# Patient Record
Sex: Male | Born: 1970 | Race: White | Hispanic: No | Marital: Married | Smoking: Former smoker
Health system: Southern US, Community
[De-identification: ages and names within clinical notes are randomized; demographics above are authoritative.]

## PROBLEM LIST (undated history)

## (undated) DIAGNOSIS — R42 Dizziness and giddiness: Secondary | ICD-10-CM

## (undated) DIAGNOSIS — G4730 Sleep apnea, unspecified: Secondary | ICD-10-CM

## (undated) DIAGNOSIS — Z87898 Personal history of other specified conditions: Secondary | ICD-10-CM

## (undated) DIAGNOSIS — K5792 Diverticulitis of intestine, part unspecified, without perforation or abscess without bleeding: Secondary | ICD-10-CM

## (undated) HISTORY — PX: LEG SURGERY: SHX1003

## (undated) HISTORY — PX: TONSILLECTOMY: SUR1361

## (undated) HISTORY — PX: UVULOPALATOPHARYNGOPLASTY: SHX827

---

## 2019-04-29 ENCOUNTER — Emergency Department (INDEPENDENT_AMBULATORY_CARE_PROVIDER_SITE_OTHER)
Admission: EM | Admit: 2019-04-29 | Discharge: 2019-04-29 | Disposition: A | Discharge status: Home / Self Care | Payer: Managed Care, Other (non HMO) | Source: Home / Self Care | Attending: Family Medicine | Admitting: Family Medicine

## 2019-04-29 ENCOUNTER — Other Ambulatory Visit: Payer: Self-pay

## 2019-04-29 ENCOUNTER — Encounter: Payer: Self-pay | Admitting: *Deleted

## 2019-04-29 DIAGNOSIS — H8111 Benign paroxysmal vertigo, right ear: Secondary | ICD-10-CM | POA: Diagnosis not present

## 2019-04-29 DIAGNOSIS — M542 Cervicalgia: Secondary | ICD-10-CM

## 2019-04-29 HISTORY — DX: Personal history of other specified conditions: Z87.898

## 2019-04-29 HISTORY — DX: Sleep apnea, unspecified: G47.30

## 2019-04-29 MED ORDER — MECLIZINE HCL 25 MG PO TABS: 25.0000 mg | ORAL_TABLET | Freq: Three times a day (TID) | ORAL | 0 refills | Status: DC | PRN

## 2019-04-29 MED ORDER — PREDNISONE 20 MG PO TABS: ORAL_TABLET | ORAL | 0 refills | Status: DC

## 2019-04-29 NOTE — Discharge Instructions (Addendum)
Increase fluid intake.  Rest. ° °If symptoms become significantly worse during the night or over the weekend, proceed to the local emergency room.  °

## 2019-04-29 NOTE — ED Triage Notes (Signed)
Pt reports that he awoke with dizziness at 0400 today. He reports some nausea with vomit x 1; diarrhea intermittently from 0400-0530. He also notes neck pain intermittently x 2 days with certain movements. Denies injury.
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
## 2020-02-21 ENCOUNTER — Emergency Department (INDEPENDENT_AMBULATORY_CARE_PROVIDER_SITE_OTHER)
Admission: EM | Admit: 2020-02-21 | Discharge: 2020-02-21 | Disposition: A | Discharge status: Home / Self Care | Payer: Managed Care, Other (non HMO) | Source: Home / Self Care

## 2020-02-21 ENCOUNTER — Other Ambulatory Visit: Payer: Self-pay

## 2020-02-21 ENCOUNTER — Emergency Department (INDEPENDENT_AMBULATORY_CARE_PROVIDER_SITE_OTHER): Payer: Managed Care, Other (non HMO)

## 2020-02-21 DIAGNOSIS — K5732 Diverticulitis of large intestine without perforation or abscess without bleeding: Secondary | ICD-10-CM | POA: Diagnosis not present

## 2020-02-21 DIAGNOSIS — R1033 Periumbilical pain: Secondary | ICD-10-CM

## 2020-02-21 DIAGNOSIS — R1030 Lower abdominal pain, unspecified: Secondary | ICD-10-CM

## 2020-02-21 LAB — POCT URINALYSIS DIP (MANUAL ENTRY)
Bilirubin, UA: NEGATIVE
Blood, UA: NEGATIVE
Glucose, UA: NEGATIVE mg/dL
Ketones, POC UA: NEGATIVE mg/dL
Leukocytes, UA: NEGATIVE
Nitrite, UA: NEGATIVE
Spec Grav, UA: >=1.03 — AB (ref 1.010–1.025)
Urobilinogen, UA: 0.2 E.U./dL
pH, UA: 5.5 (ref 5.0–8.0)

## 2020-02-21 LAB — POCT CBC W AUTO DIFF (K'VILLE URGENT CARE)

## 2020-02-21 MED ORDER — AMOXICILLIN-POT CLAVULANATE 875-125 MG PO TABS: 1.0000 | ORAL_TABLET | Freq: Two times a day (BID) | ORAL | 0 refills | Status: DC

## 2020-02-21 MED ORDER — IOHEXOL 300 MG/ML  SOLN
100.0000 mL | Freq: Once | INTRAMUSCULAR | Status: AC | PRN
  Administered 2020-02-21: 100 mL via INTRAVENOUS

## 2020-02-21 MED ORDER — ONDANSETRON 4 MG PO TBDP: 4.0000 mg | ORAL_TABLET | Freq: Four times a day (QID) | ORAL | 0 refills | Status: DC | PRN

## 2020-02-21 MED ORDER — TRAMADOL HCL 50 MG PO TABS: 50.0000 mg | ORAL_TABLET | Freq: Four times a day (QID) | ORAL | 0 refills | Status: DC | PRN

## 2020-02-21 NOTE — Discharge Instructions (Signed)
  Please take antibiotics as prescribed and be sure to complete entire course even if you start to feel better to ensure infection does not come back.  Tramadol is strong pain medication. While taking, do not drink alcohol, drive, or perform any other activities that requires focus while taking these medications.   It is recommended you stick with a clear liquid diet today and slowly transition to a soft diet, then regular diet as pain and nausea start to resolve.  Please schedule a follow up appointment with a GI (stomach) specialist and a primary care provider as it is recommended you receive a colonoscopy after your first episode of diverticulitis and to make sure your symptoms are improving.  Call 911 or go to the hospital if symptoms worsening- worsening pain, unable to keep down fluids, dizziness, or other new concerning symptoms develop.
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
## 2020-02-21 NOTE — ED Triage Notes (Signed)
Patient presents to Urgent Care with complaints of abdominal pain since yesterday morning, progressively getting worse. Patient reports it got so bad it made him nauseous by the end of yesterday. Pt endorses severe bladder burning, worse when he urinates. Pt states he has also felt achy in his shoulders and neck. Pt denies vomiting or diarrhea.

## 2020-02-22 LAB — COMPLETE METABOLIC PANEL WITH GFR
AG Ratio: 1.1 (calc) (ref 1.0–2.5)
ALT: 16 U/L (ref 9–46)
AST: 23 U/L (ref 10–40)
Albumin: 4.2 g/dL (ref 3.6–5.1)
Alkaline phosphatase (APISO): 65 U/L (ref 36–130)
BUN: 13 mg/dL (ref 7–25)
CO2: 21 mmol/L (ref 20–32)
Calcium: 9.2 mg/dL (ref 8.6–10.3)
Chloride: 104 mmol/L (ref 98–110)
Creat: 0.94 mg/dL (ref 0.60–1.35)
GFR, Est African American: 111 mL/min/{1.73_m2} (ref >=60)
GFR, Est Non African American: 95 mL/min/{1.73_m2} (ref >=60)
Globulin: 3.7 g/dL (calc) (ref 1.9–3.7)
Glucose, Bld: 100 mg/dL — ABNORMAL HIGH (ref 65–99)
Potassium: 4.9 mmol/L (ref 3.5–5.3)
Sodium: 137 mmol/L (ref 135–146)
Total Bilirubin: 0.9 mg/dL (ref 0.2–1.2)
Total Protein: 7.9 g/dL (ref 6.1–8.1)

## 2020-02-22 LAB — URINE CULTURE
MICRO NUMBER:: 10357201
Result:: NO GROWTH
SPECIMEN QUALITY:: ADEQUATE

## 2020-02-22 LAB — LIPASE: Lipase: 9 U/L (ref 7–60)

## 2020-12-02 ENCOUNTER — Emergency Department (INDEPENDENT_AMBULATORY_CARE_PROVIDER_SITE_OTHER): Payer: Worker's Compensation

## 2020-12-02 ENCOUNTER — Other Ambulatory Visit: Payer: Self-pay

## 2020-12-02 ENCOUNTER — Emergency Department (INDEPENDENT_AMBULATORY_CARE_PROVIDER_SITE_OTHER)
Admission: EM | Admit: 2020-12-02 | Discharge: 2020-12-02 | Disposition: A | Discharge status: Home / Self Care | Payer: Worker's Compensation | Source: Home / Self Care

## 2020-12-02 ENCOUNTER — Encounter: Payer: Self-pay | Admitting: Emergency Medicine

## 2020-12-02 DIAGNOSIS — M25469 Effusion, unspecified knee: Secondary | ICD-10-CM

## 2020-12-02 DIAGNOSIS — M1711 Unilateral primary osteoarthritis, right knee: Secondary | ICD-10-CM

## 2020-12-02 DIAGNOSIS — M179 Osteoarthritis of knee, unspecified: Secondary | ICD-10-CM

## 2020-12-02 DIAGNOSIS — M25461 Effusion, right knee: Secondary | ICD-10-CM

## 2020-12-02 DIAGNOSIS — S8991XA Unspecified injury of right lower leg, initial encounter: Secondary | ICD-10-CM
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
## 2020-12-02 NOTE — ED Triage Notes (Signed)
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

## 2020-12-02 NOTE — Discharge Instructions (Signed)
  You may take 500mg  acetaminophen every 4-6 hours or in combination with ibuprofen 400-600mg  every 6-8 hours as needed for pain and inflammation. You may wear the brace for comfort.  Elevate and apply a cool compress to help with pain and swelling. Call to schedule a follow up appointment with Sports Medicine this week for further evaluation and treatment.

## 2020-12-04 ENCOUNTER — Other Ambulatory Visit: Payer: Self-pay

## 2020-12-04 ENCOUNTER — Ambulatory Visit (INDEPENDENT_AMBULATORY_CARE_PROVIDER_SITE_OTHER): Payer: Managed Care, Other (non HMO) | Admitting: Family Medicine

## 2020-12-04 ENCOUNTER — Ambulatory Visit: Payer: Self-pay

## 2020-12-04 VITALS — BP 132/84 | Ht 70.0 in | Wt 300.0 lb

## 2020-12-04 DIAGNOSIS — M1711 Unilateral primary osteoarthritis, right knee: Secondary | ICD-10-CM | POA: Insufficient documentation

## 2020-12-04 DIAGNOSIS — M25561 Pain in right knee: Secondary | ICD-10-CM

## 2020-12-04 MED ORDER — PREDNISONE 5 MG PO TABS: ORAL_TABLET | ORAL | 0 refills | Status: DC
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
## 2020-12-04 NOTE — Assessment & Plan Note (Signed)
Injury occurred on 1/21.  Injury occurred at work.  Seems to have exacerbated underlying degenerative changes. -Counseled on home exercise therapy and supportive care. -Counseled on knee brace. -Prednisone. -Could consider physical therapy or injection.

## 2020-12-04 NOTE — Patient Instructions (Signed)
Nice to meet you  Please use ice as needed  Please try the range of motion  Please send me a message in MyChart with any questions or updates.  Please see me back in 2 weeks.   --Dr. Jordan Likes

## 2020-12-18 ENCOUNTER — Telehealth: Payer: Self-pay | Admitting: Family Medicine

## 2020-12-18 ENCOUNTER — Ambulatory Visit: Payer: Managed Care, Other (non HMO) | Admitting: Family Medicine

## 2020-12-18 NOTE — Telephone Encounter (Signed)
Letter printed, signed and emailed to pt at email below.

## 2020-12-18 NOTE — Telephone Encounter (Signed)
Provided extension on work note.   Myra Rude, MD [facility] 12/18/2020, 12:02 PM
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
## 2020-12-18 NOTE — Telephone Encounter (Signed)
Amendment to previous note-- Pt states needs Out of Work note written extending his leave until next OV w/Dr. 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

## 2020-12-18 NOTE — Telephone Encounter (Signed)
Called pt to reschedule 2/8 WC follow up appt w/ Dr.Schmitz, per pt RTW note only written till 2/9 & he needs it extended until his ne

## 2020-12-21 ENCOUNTER — Ambulatory Visit: Payer: Managed Care, Other (non HMO) | Admitting: Family Medicine

## 2020-12-24 ENCOUNTER — Other Ambulatory Visit: Payer: Self-pay

## 2020-12-24 ENCOUNTER — Ambulatory Visit (INDEPENDENT_AMBULATORY_CARE_PROVIDER_SITE_OTHER): Payer: Worker's Compensation | Admitting: Family Medicine

## 2020-12-24 VITALS — BP 140/82 | Ht 70.0 in | Wt 300.0 lb

## 2020-12-24 DIAGNOSIS — M1711 Unilateral primary osteoarthritis, right knee: Secondary | ICD-10-CM

## 2020-12-24 NOTE — Assessment & Plan Note (Signed)
Injury occurred on 1/21.  Injury occurred at work.  Pain is ongoing.  Has had improvement with measures made thus far. -Counseled on home exercise therapy and supportive care. -Referral to physical therapy. -Work note provided.

## 2020-12-24 NOTE — Patient Instructions (Signed)
Good to see you Please use ice as needed  Please try physical therapy   Please send me a message in MyChart with any questions or updates.  Please see me back in 4 weeks.   --Dr. Aveena Bari  
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
## 2021-01-22 ENCOUNTER — Ambulatory Visit: Payer: Managed Care, Other (non HMO) | Admitting: Family Medicine

## 2021-06-05 ENCOUNTER — Ambulatory Visit: Payer: Managed Care, Other (non HMO) | Admitting: Family Medicine
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
## 2022-07-05 ENCOUNTER — Ambulatory Visit
Admission: EM | Admit: 2022-07-05 | Discharge: 2022-07-05 | Disposition: A | Discharge status: Home / Self Care | Payer: 59 | Attending: Family Medicine | Admitting: Family Medicine

## 2022-07-05 ENCOUNTER — Other Ambulatory Visit: Payer: Self-pay

## 2022-07-05 DIAGNOSIS — J3489 Other specified disorders of nose and nasal sinuses: Secondary | ICD-10-CM | POA: Diagnosis not present

## 2022-07-05 DIAGNOSIS — J0100 Acute maxillary sinusitis, unspecified: Secondary | ICD-10-CM | POA: Diagnosis not present

## 2022-07-05 MED ORDER — AMOXICILLIN-POT CLAVULANATE 875-125 MG PO TABS: 1.0000 | ORAL_TABLET | Freq: Two times a day (BID) | ORAL | 0 refills | Status: DC

## 2022-07-05 MED ORDER — PREDNISONE 10 MG (21) PO TBPK: ORAL_TABLET | Freq: Every day | ORAL | 0 refills | Status: DC
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
## 2022-07-05 NOTE — ED Triage Notes (Signed)
Pt c/o of right side facial swelling and right eye swelling onset since thursday

## 2022-07-05 NOTE — Discharge Instructions (Addendum)
Advised patient to take medication as directed with food to completion.  Advised patient to take Sterapred Unipak with first dose of Augmentin for the next 10 days.  Encouraged patient increase daily water intake while taking these medications.  Advised patient if symptoms worsen and/or unresolved please follow-up with PCP or here for further evaluation.

## 2022-09-19 ENCOUNTER — Ambulatory Visit
Admission: EM | Admit: 2022-09-19 | Discharge: 2022-09-19 | Disposition: A | Discharge status: Home / Self Care | Payer: 59 | Attending: Family Medicine | Admitting: Family Medicine

## 2022-09-19 ENCOUNTER — Ambulatory Visit (INDEPENDENT_AMBULATORY_CARE_PROVIDER_SITE_OTHER): Payer: 59

## 2022-09-19 DIAGNOSIS — M940 Chondrocostal junction syndrome [Tietze]: Secondary | ICD-10-CM | POA: Diagnosis not present

## 2022-09-19 DIAGNOSIS — J069 Acute upper respiratory infection, unspecified: Secondary | ICD-10-CM

## 2022-09-19 DIAGNOSIS — R059 Cough, unspecified: Secondary | ICD-10-CM | POA: Insufficient documentation

## 2022-09-19 DIAGNOSIS — J029 Acute pharyngitis, unspecified: Secondary | ICD-10-CM | POA: Diagnosis not present

## 2022-09-19 DIAGNOSIS — Z1152 Encounter for screening for COVID-19: Secondary | ICD-10-CM | POA: Diagnosis not present

## 2022-09-19 LAB — RESP PANEL BY RT-PCR (FLU A&B, COVID) ARPGX2
Influenza A by PCR: NEGATIVE
Influenza B by PCR: NEGATIVE
SARS Coronavirus 2 by RT PCR: NEGATIVE

## 2022-09-19 MED ORDER — AZITHROMYCIN 250 MG PO TABS: ORAL_TABLET | ORAL | 0 refills | Status: DC

## 2022-09-19 MED ORDER — PREDNISONE 20 MG PO TABS: ORAL_TABLET | ORAL | 0 refills | Status: DC

## 2022-09-19 NOTE — ED Triage Notes (Signed)
Pt presents to Urgent Care with c/o sore throat, body aches, and cough x 3 days. Has not done COVID test.

## 2022-09-19 NOTE — Discharge Instructions (Signed)
Take plain guaifenesin (1200mg extended release tabs such as Mucinex) twice daily, with plenty of water, for cough and congestion. Get adequate rest.   May take Delsym Cough Suppressant ("12 Hour Cough Relief") at bedtime for nighttime cough.  Try warm salt water gargles for sore throat.  Stop all antihistamines for now, and other non-prescription cough/cold preparations.   If symptoms become significantly worse during the night or over the weekend, proceed to the local emergency room.  
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
## 2023-02-25 ENCOUNTER — Encounter: Payer: Self-pay | Admitting: *Deleted

## 2023-08-27 ENCOUNTER — Other Ambulatory Visit: Payer: Self-pay

## 2023-08-27 ENCOUNTER — Ambulatory Visit: Payer: No Typology Code available for payment source

## 2023-08-27 ENCOUNTER — Ambulatory Visit
Admission: EM | Admit: 2023-08-27 | Discharge: 2023-08-27 | Disposition: A | Discharge status: Home / Self Care | Payer: No Typology Code available for payment source | Attending: Family Medicine | Admitting: Family Medicine

## 2023-08-27 ENCOUNTER — Encounter: Payer: Self-pay | Admitting: Emergency Medicine

## 2023-08-27 DIAGNOSIS — M542 Cervicalgia: Secondary | ICD-10-CM

## 2023-08-27 DIAGNOSIS — R200 Anesthesia of skin: Secondary | ICD-10-CM

## 2023-08-27 DIAGNOSIS — Z87898 Personal history of other specified conditions: Secondary | ICD-10-CM | POA: Diagnosis not present

## 2023-08-27 DIAGNOSIS — R202 Paresthesia of skin: Secondary | ICD-10-CM | POA: Diagnosis not present

## 2023-08-27 MED ORDER — METHYLPREDNISOLONE 4 MG PO TBPK: ORAL_TABLET | ORAL | 0 refills | Status: DC

## 2023-08-27 MED ORDER — OXYCODONE-ACETAMINOPHEN 10-325 MG PO TABS: 1.0000 | ORAL_TABLET | ORAL | 0 refills | Status: DC | PRN

## 2023-08-27 MED ORDER — KETOROLAC TROMETHAMINE 30 MG/ML IJ SOLN
60.0000 mg | Freq: Once | INTRAMUSCULAR | Status: AC
  Administered 2023-08-27: 60 mg via INTRAMUSCULAR

## 2023-08-27 MED ORDER — OXYCODONE-ACETAMINOPHEN 10-325 MG PO TABS: 1.0000 | ORAL_TABLET | Freq: Three times a day (TID) | ORAL | 0 refills | Status: DC | PRN

## 2023-08-27 MED ORDER — TIZANIDINE HCL 4 MG PO TABS: 4.0000 mg | ORAL_TABLET | Freq: Four times a day (QID) | ORAL | 0 refills | Status: DC | PRN

## 2023-08-27 NOTE — Discharge Instructions (Addendum)
Use ice or heat to painful neck muscles Take the Medrol as directed.  Take all of day 1 today.  This is a steroid anti-inflammatory Take tizanidine as a muscle relaxer.  May take up to 3 times a day.  It is important to take a dose at bedtime Take oxycodone in addition as needed for pain.  Do not drive on tramadol See sports medicine if not improving by next week.  I have given you the number for Dr. Benjamin Stain in this building.  He can coordinate physical therapy or additional treatment that may be needed
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
## 2023-08-27 NOTE — ED Triage Notes (Signed)
Neck pain since Tuesday morning .  Woke with pain.  No known injury.  No specific section of neck hurts.  Pain radiating into head and upper back.  Patient is right handed.  Left hand intermittently tingling.

## 2023-08-27 NOTE — ED Triage Notes (Signed)
Patient has a family member with him in lobby.    Patient has been taking goody powders.

## 2024-08-11 ENCOUNTER — Ambulatory Visit
Admission: EM | Admit: 2024-08-11 | Discharge: 2024-08-11 | Disposition: A | Discharge status: Home / Self Care | Attending: Family Medicine | Admitting: Family Medicine

## 2024-08-11 ENCOUNTER — Other Ambulatory Visit: Payer: Self-pay

## 2024-08-11 DIAGNOSIS — H938X2 Other specified disorders of left ear: Secondary | ICD-10-CM

## 2024-08-11 DIAGNOSIS — K5732 Diverticulitis of large intestine without perforation or abscess without bleeding: Secondary | ICD-10-CM

## 2024-08-11 DIAGNOSIS — K5730 Diverticulosis of large intestine without perforation or abscess without bleeding: Secondary | ICD-10-CM

## 2024-08-11 HISTORY — DX: Dizziness and giddiness: R42

## 2024-08-11 HISTORY — DX: Diverticulitis of intestine, part unspecified, without perforation or abscess without bleeding: K57.92

## 2024-08-11 LAB — POCT URINALYSIS DIP (MANUAL ENTRY)
Bilirubin, UA: NEGATIVE
Blood, UA: NEGATIVE
Glucose, UA: NEGATIVE mg/dL
Ketones, POC UA: NEGATIVE mg/dL
Leukocytes, UA: NEGATIVE
Nitrite, UA: NEGATIVE
Protein Ur, POC: NEGATIVE mg/dL
Spec Grav, UA: 1.025 (ref 1.010–1.025)
Urobilinogen, UA: 0.2 U/dL
pH, UA: 5.5 (ref 5.0–8.0)

## 2024-08-11 MED ORDER — AMOXICILLIN-POT CLAVULANATE 875-125 MG PO TABS: 1.0000 | ORAL_TABLET | Freq: Two times a day (BID) | ORAL | 0 refills | Status: AC

## 2024-08-11 MED ORDER — HYDROCODONE-ACETAMINOPHEN 7.5-325 MG PO TABS: 1.0000 | ORAL_TABLET | Freq: Four times a day (QID) | ORAL | 0 refills | Status: AC | PRN

## 2024-08-11 NOTE — Discharge Instructions (Addendum)
 Drink lots of fluids Bland diet Take the Augmentin  2 times a day as directed May take Tylenol  for mild pain, take hydrocodone if needed for severe pain Do not drive on hydrocodone If your abdominal pain should become acutely worse, or if you develop vomiting or fever, go directly to an emergency room

## 2024-08-11 NOTE — ED Triage Notes (Addendum)
 Since Monday night/Tuesday morning, has had pelvic pain. At times pain is severe, has noticed the pain increases after emptying bladder. No pain with urination. Has hx of diverticulitis and sts it feels like this. Since last week has had left sided ear stopped up, dizziness off and on. Hx of vertigo. Has had some diarrhea, no vomiting but has had nausea at times. No fever. No otc meds.

## 2024-08-11 NOTE — ED Notes (Signed)
 Urine color was amber
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
## 2024-08-12 ENCOUNTER — Telehealth: Payer: Self-pay

## 2024-08-12 NOTE — Telephone Encounter (Signed)
 Called patient per call back protocol. Pt reports he was able to pick up medications. Pt reports he is feeling okay but not better. Advised to call back with any questions or concerns.

## 2024-08-15 ENCOUNTER — Ambulatory Visit
Admission: EM | Admit: 2024-08-15 | Discharge: 2024-08-15 | Disposition: A | Discharge status: Home / Self Care | Attending: Family Medicine | Admitting: Family Medicine

## 2024-08-15 ENCOUNTER — Other Ambulatory Visit: Payer: Self-pay

## 2024-08-15 DIAGNOSIS — B9789 Other viral agents as the cause of diseases classified elsewhere: Secondary | ICD-10-CM

## 2024-08-15 DIAGNOSIS — J019 Acute sinusitis, unspecified: Secondary | ICD-10-CM | POA: Diagnosis not present

## 2024-08-15 MED ORDER — PREDNISONE 50 MG PO TABS: ORAL_TABLET | ORAL | 0 refills | Status: AC

## 2024-08-15 NOTE — ED Triage Notes (Signed)
 Sinus pressure/pain and dizziness since yesterday. Has pressure behind eyes. Has had sinus issues for a week, reports left side of head/ear is stopped up. No fever. No otc meds.
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
## 2024-08-15 NOTE — Discharge Instructions (Signed)
 Make sure you are drinking lots of water Take the prednisone  once a day for 5 days Complete the antibiotics as directed
